# Patient Record
Sex: Male | Born: 1960 | Race: White | Hispanic: No | Marital: Married | Smoking: Current every day smoker
Health system: Southern US, Community
[De-identification: ages and names within clinical notes are randomized; demographics above are authoritative.]

## PROBLEM LIST (undated history)

## (undated) DIAGNOSIS — N503 Cyst of epididymis: Secondary | ICD-10-CM

## (undated) HISTORY — DX: Cyst of epididymis: N50.3

## (undated) HISTORY — PX: NASAL SINUS SURGERY: SHX719

## (undated) HISTORY — PX: KNEE SURGERY: SHX244

---

## 2012-08-24 ENCOUNTER — Ambulatory Visit: Payer: Self-pay | Admitting: Family Medicine

## 2013-11-27 ENCOUNTER — Encounter: Payer: Self-pay | Admitting: *Deleted

## 2013-11-27 ENCOUNTER — Emergency Department (INDEPENDENT_AMBULATORY_CARE_PROVIDER_SITE_OTHER)
Admission: EM | Admit: 2013-11-27 | Discharge: 2013-11-27 | Disposition: A | Discharge status: Home / Self Care | Payer: BC Managed Care – PPO | Source: Home / Self Care | Attending: Family Medicine | Admitting: Family Medicine

## 2013-11-27 DIAGNOSIS — J209 Acute bronchitis, unspecified: Secondary | ICD-10-CM

## 2013-11-27 MED ORDER — ALBUTEROL SULFATE HFA 108 (90 BASE) MCG/ACT IN AERS: 1.0000 | INHALATION_SPRAY | Freq: Four times a day (QID) | RESPIRATORY_TRACT | Status: DC | PRN

## 2013-11-27 MED ORDER — PREDNISONE 50 MG PO TABS: 50.0000 mg | ORAL_TABLET | Freq: Every day | ORAL | Status: DC

## 2013-11-27 MED ORDER — IPRATROPIUM-ALBUTEROL 0.5-2.5 (3) MG/3ML IN SOLN
3.0000 mL | Freq: Once | RESPIRATORY_TRACT | Status: AC
  Administered 2013-11-27: 3 mL via RESPIRATORY_TRACT

## 2013-11-27 NOTE — ED Notes (Signed)
Pt states he developed nasal congestion 1 week ago and it turned into a cough with yellow and white mucous and is now a dry cough.  Pt experiencing SOB and fatigue.

## 2013-11-27 NOTE — Discharge Instructions (Signed)
Thank you for coming in today. °Call or go to the emergency room if you get worse, have trouble breathing, have chest pains, or palpitations.  °Please quit smoking. ° °Acute Bronchitis °Bronchitis is inflammation of the airways that extend from the windpipe into the lungs (bronchi). The inflammation often causes mucus to develop. This leads to a cough, which is the most common symptom of bronchitis.  °In acute bronchitis, the condition usually develops suddenly and goes away over time, usually in a couple weeks. Smoking, allergies, and asthma can make bronchitis worse. Repeated episodes of bronchitis may cause further lung problems.  °CAUSES °Acute bronchitis is most often caused by the same virus that causes a cold. The virus can spread from person to person (contagious) through coughing, sneezing, and touching contaminated objects. °SIGNS AND SYMPTOMS  °· Cough.   °· Fever.   °· Coughing up mucus.   °· Body aches.   °· Chest congestion.   °· Chills.   °· Shortness of breath.   °· Sore throat.   °DIAGNOSIS  °Acute bronchitis is usually diagnosed through a physical exam. Your health care provider will also ask you questions about your medical history. Tests, such as chest X-rays, are sometimes done to rule out other conditions.  °TREATMENT  °Acute bronchitis usually goes away in a couple weeks. Oftentimes, no medical treatment is necessary. Medicines are sometimes given for relief of fever or cough. Antibiotic medicines are usually not needed but may be prescribed in certain situations. In some cases, an inhaler may be recommended to help reduce shortness of breath and control the cough. A cool mist vaporizer may also be used to help thin bronchial secretions and make it easier to clear the chest.  °HOME CARE INSTRUCTIONS °· Get plenty of rest.   °· Drink enough fluids to keep your urine clear or pale yellow (unless you have a medical condition that requires fluid restriction). Increasing fluids may help thin your  respiratory secretions (sputum) and reduce chest congestion, and it will prevent dehydration.   °· Take medicines only as directed by your health care provider. °· If you were prescribed an antibiotic medicine, finish it all even if you start to feel better. °· Avoid smoking and secondhand smoke. Exposure to cigarette smoke or irritating chemicals will make bronchitis worse. If you are a smoker, consider using nicotine gum or skin patches to help control withdrawal symptoms. Quitting smoking will help your lungs heal faster.   °· Reduce the chances of another bout of acute bronchitis by washing your hands frequently, avoiding people with cold symptoms, and trying not to touch your hands to your mouth, nose, or eyes.   °· Keep all follow-up visits as directed by your health care provider.   °SEEK MEDICAL CARE IF: °Your symptoms do not improve after 1 week of treatment.  °SEEK IMMEDIATE MEDICAL CARE IF: °· You develop an increased fever or chills.   °· You have chest pain.   °· You have severe shortness of breath. °· You have bloody sputum.   °· You develop dehydration. °· You faint or repeatedly feel like you are going to pass out. °· You develop repeated vomiting. °· You develop a severe headache. °MAKE SURE YOU:  °· Understand these instructions. °· Will watch your condition. °· Will get help right away if you are not doing well or get worse. °Document Released: 02/01/2004 Document Revised: 05/10/2013 Document Reviewed: 06/16/2012 °[employer]® Patient Information ©2015 [employer]. This information is not intended to replace advice given to you by your health care provider. Make sure you discuss any questions you   have with your health care provider. ° °

## 2013-11-27 NOTE — ED Provider Notes (Signed)
Jeffery Blackburn is a 53 y.o. male who presents to Urgent Care today for nasal congestion and cough wheezing and some shortness of breath. The cough is productive. Symptoms present for about a week. Patient had fever initially but none currently. No vomiting or diarrhea. Patient has tried Mucinex and Aleve which have helped.   History reviewed. No pertinent past medical history. Past Surgical History  Procedure Laterality Date  . Knee surgery     History  Substance Use Topics  . Smoking status: Current Every Day Smoker -- 0.50 packs/day    Types: Cigarettes  . Smokeless tobacco: Never Used  . Alcohol Use: 4.8 oz/week    8 Cans of beer per week   ROS as above Medications: Current Facility-Administered Medications  Medication Dose Route Frequency Provider Last Rate Last Dose  . ipratropium-albuterol (DUONEB) 0.5-2.5 (3) MG/3ML nebulizer solution 3 mL  3 mL Nebulization Once Rodolph BongEvan S Myrla Malanowski, MD       Current Outpatient Prescriptions  Medication Sig Dispense Refill  . dextromethorphan-guaiFENesin (MUCINEX DM) 30-600 MG per 12 hr tablet Take 1 tablet by mouth 2 (two) times daily.     No Known Allergies   Exam:  BP 142/86 mmHg  Pulse 70  Temp(Src) 97.7 F (36.5 C) (Oral)  Ht 5\' 9"  (1.753 m)  Wt 245 lb 8 oz (111.358 kg)  BMI 36.24 kg/m2  SpO2 99% Gen: Well NAD HEENT: EOMI,  MMM normal posterior pharynx and tympanic membranes Lungs: Normal work of breathing. Wheezing present bilaterally with expiratory phase Heart: RRR no MRG Abd: NABS, Soft. Nondistended, Nontender Exts: Brisk capillary refill, warm and well perfused.   Patient was given a 2.5/0.5 mg DuoNeb nebulizer treatment, and felt better  No results found for this or any previous visit (from the past 24 hour(s)). No results found.  Assessment and Plan: 53 y.o. male with viral bronchitis compensated by smoking. Treatment with prednisone and albuterol.  Discussed warning signs or symptoms. Please see discharge  instructions. Patient expresses understanding.     Rodolph BongEvan S Tachina Spoonemore, MD 11/27/13 1007

## 2013-12-08 ENCOUNTER — Emergency Department (INDEPENDENT_AMBULATORY_CARE_PROVIDER_SITE_OTHER)
Admission: EM | Admit: 2013-12-08 | Discharge: 2013-12-08 | Disposition: A | Discharge status: Home / Self Care | Payer: BC Managed Care – PPO | Source: Home / Self Care | Attending: Family Medicine | Admitting: Family Medicine

## 2013-12-08 ENCOUNTER — Encounter: Payer: Self-pay | Admitting: Emergency Medicine

## 2013-12-08 DIAGNOSIS — B9789 Other viral agents as the cause of diseases classified elsewhere: Principal | ICD-10-CM

## 2013-12-08 DIAGNOSIS — J069 Acute upper respiratory infection, unspecified: Secondary | ICD-10-CM

## 2013-12-08 MED ORDER — PENICILLIN V POTASSIUM 500 MG PO TABS: ORAL_TABLET | ORAL | Status: DC

## 2013-12-08 MED ORDER — ALBUTEROL SULFATE HFA 108 (90 BASE) MCG/ACT IN AERS: 1.0000 | INHALATION_SPRAY | Freq: Four times a day (QID) | RESPIRATORY_TRACT | Status: DC | PRN

## 2013-12-08 MED ORDER — BENZONATATE 200 MG PO CAPS: 200.0000 mg | ORAL_CAPSULE | Freq: Every day | ORAL | Status: DC

## 2013-12-08 NOTE — Discharge Instructions (Signed)
Take plain Mucinex (1200 mg guaifenesin) twice daily for cough and congestion.  May add Sudafed for sinus congestion.   Increase fluid intake, rest. May use Afrin nasal spray (or generic oxymetazoline) twice daily for about 5 days.  Also recommend using saline nasal spray several times daily and saline nasal irrigation (AYR is a common brand) Try warm salt water gargles for sore throat.  Stop all antihistamines for now, and other non-prescription cough/cold preparations. Continue inhaler as needed.  Follow-up with family doctor if not improving 7 to 10 days.

## 2013-12-08 NOTE — ED Provider Notes (Signed)
CSN: [id number]     Arrival date & time 12/08/13  0848 History   First MD Initiated Contact with Patient 12/08/13 [phone]     Chief Complaint  Patient presents with  . Nasal Congestion  . Cough  . Sore Throat      HPI Comments: Patient reports that he developed a cold about 2.5 weeks ago that finally resolved.  Three days ago he developed sweats.  Yesterday he developed increased sinus congestion, sore throat, fatigue, and cough with mild wheezing.  The history is provided by the patient.    History reviewed. No pertinent past medical history. Past Surgical History  Procedure Laterality Date  . Knee surgery     History reviewed. No pertinent family history. History  Substance Use Topics  . Smoking status: Current Every Day Smoker -- 0.50 packs/day    Types: Cigarettes  . Smokeless tobacco: Never Used  . Alcohol Use: 4.8 oz/week    8 Cans of beer per week    Review of Systems + sore throat + cough No pleuritic pain + wheezing + nasal congestion + post-nasal drainage No sinus pain/pressure No itchy/red eyes No earache No hemoptysis No SOB No fever, + chills/sweats No nausea No vomiting No abdominal pain No diarrhea No urinary symptoms No skin rash + fatigue No myalgias No headache Used OTC meds without relief  Allergies  Review of patient's allergies indicates no known allergies.  Home Medications   Prior to Admission medications   Medication Sig Start Date End Date Taking? Authorizing Provider  albuterol (PROVENTIL HFA;VENTOLIN HFA) 108 (90 BASE) MCG/ACT inhaler Inhale 1-2 puffs into the lungs every 6 (six) hours as needed for wheezing or shortness of breath. 12/08/13   Lattie HawStephen A Azaiah Licciardi, MD  benzonatate (TESSALON) 200 MG capsule Take 1 capsule (200 mg total) by mouth at bedtime. Take as needed for cough 12/08/13   Lattie HawStephen A Shawny Borkowski, MD  penicillin v potassium (VEETID) 500 MG tablet Take one tab by mouth twice daily for 10 days 12/08/13   Lattie HawStephen A German Manke, MD   BP  128/75 mmHg  Temp(Src) 98.7 F (37.1 C) (Oral)  Resp 16  Ht 5\' 10"  (1.778 m)  Wt 243 lb (110.224 kg)  BMI 34.87 kg/m2  SpO2 94% Physical Exam Nursing notes and Vital Signs reviewed. Appearance:  Patient appears stated age, and in no acute distress.  Patient is obese (BMI 34.9) Eyes:  Pupils are equal, round, and reactive to light and accomodation.  Extraocular movement is intact.  Conjunctivae are not inflamed  Ears:  Canals normal.  Tympanic membranes normal.  Nose:  Mildly congested turbinates.  No sinus tenderness.   Pharynx:  Normal Neck:  Supple.  Slightly tender shotty posterior nodes are palpated bilaterally  Lungs:  Clear to auscultation.  Breath sounds are equal.  Heart:  Regular rate and rhythm without murmurs, rubs, or gallops.  Abdomen:  Nontender without masses or hepatosplenomegaly.  Bowel sounds are present.  No CVA or flank tenderness.  Extremities:  No edema.  No calf tenderness Skin:  No rash present.   ED Course  Procedures  none   MDM   1. Viral URI with cough; recurrent     Will cover with antibiotic (patient requests penicillin). Prescription written for Benzonatate [facility](Tessalon) to take at bedtime for night-time cough.  Take plain Mucinex (1200 mg guaifenesin) twice daily for cough and congestion.  May add Sudafed for sinus congestion.   Increase fluid intake, rest. May use Afrin nasal spray (or  generic oxymetazoline) twice daily for about 5 days.  Also recommend using saline nasal spray several times daily and saline nasal irrigation (AYR is a common brand) Try warm salt water gargles for sore throat.  Stop all antihistamines for now, and other non-prescription cough/cold preparations. Continue inhaler as needed.  Follow-up with family doctor if not improving 7 to 10 days.    Lattie HawStephen A Betzabe Bevans, MD 12/12/13 [phone]

## 2013-12-08 NOTE — ED Notes (Signed)
States has had cough, congestion and some soreness of throat for 4 weeks; [facility] for same. No OTCs recently.

## 2015-10-16 ENCOUNTER — Emergency Department (INDEPENDENT_AMBULATORY_CARE_PROVIDER_SITE_OTHER): Payer: BLUE CROSS/BLUE SHIELD

## 2015-10-16 ENCOUNTER — Emergency Department (INDEPENDENT_AMBULATORY_CARE_PROVIDER_SITE_OTHER)
Admission: EM | Admit: 2015-10-16 | Discharge: 2015-10-16 | Disposition: A | Discharge status: Home / Self Care | Payer: BLUE CROSS/BLUE SHIELD | Source: Home / Self Care | Attending: Family Medicine | Admitting: Family Medicine

## 2015-10-16 ENCOUNTER — Encounter: Payer: Self-pay | Admitting: *Deleted

## 2015-10-16 DIAGNOSIS — N5089 Other specified disorders of the male genital organs: Secondary | ICD-10-CM

## 2015-10-16 DIAGNOSIS — R229 Localized swelling, mass and lump, unspecified: Secondary | ICD-10-CM | POA: Diagnosis not present

## 2015-10-16 DIAGNOSIS — N509 Disorder of male genital organs, unspecified: Secondary | ICD-10-CM

## 2015-10-16 DIAGNOSIS — N492 Inflammatory disorders of scrotum: Secondary | ICD-10-CM

## 2015-10-16 LAB — POCT URINALYSIS DIP (MANUAL ENTRY)
Bilirubin, UA: NEGATIVE
Blood, UA: NEGATIVE
Glucose, UA: NEGATIVE
Ketones, POC UA: NEGATIVE
Leukocytes, UA: NEGATIVE
Nitrite, UA: NEGATIVE
Protein Ur, POC: NEGATIVE
Spec Grav, UA: 1.02 (ref 1.005–1.03)
Urobilinogen, UA: 0.2 (ref 0–1)
pH, UA: 6.5 (ref 5–8)

## 2015-10-16 NOTE — ED Provider Notes (Signed)
[facility]    CSN: [id number] Arrival date & time: 10/16/15  0841     History   Chief Complaint Chief Complaint  Patient presents with  . Testicle Pain    HPI Rollene FareFoy Jeffery Blackburn is a 55 y.o. male.   Patient reports that he noted a slightly tender nodule on the posterior aspect of his right testicle about 6 weeks ago.  He has no pain although the lesion is mildly tender with palpation. The lesion has not changed in size.  He denies GU symptoms.  He feels well otherwise. He states that he needs to establish care with a PCP for continuity of care.    The history is provided by the patient.  Testicle Pain  This is a new problem. Episode onset: 6 weeks ago. The problem occurs constantly. The problem has not changed since onset.Pertinent negatives include no abdominal pain and no headaches. Nothing aggravates the symptoms. Nothing relieves the symptoms. He has tried nothing for the symptoms.    History reviewed. No pertinent past medical history.  There are no active problems to display for this patient.   Past Surgical History:  Procedure Laterality Date  . KNEE SURGERY    . NASAL SINUS SURGERY         Home Medications    Prior to Admission medications   Not on File    Family History History reviewed. No pertinent family history.  Social History Social History  Substance Use Topics  . Smoking status: Current Every Day Smoker    Packs/day: 0.50    Types: Cigarettes  . Smokeless tobacco: Never Used  . Alcohol use 4.8 oz/week    8 Cans of beer per week     Allergies   Review of patient's allergies indicates no known allergies.   Review of Systems Review of Systems  Constitutional: Negative.   HENT: Negative.   Eyes: Negative.   Respiratory: Negative.   Cardiovascular: Negative.   Gastrointestinal: Negative.  Negative for abdominal pain.  Genitourinary: Positive for testicular pain. Negative for discharge, dysuria, flank pain, frequency,  genital sores, hematuria, penile pain, penile swelling, scrotal swelling and urgency.  Musculoskeletal: Negative.   Skin: Negative.   Neurological: Negative for headaches.     Physical Exam Triage Vital Signs ED Triage Vitals  Enc Vitals Group     BP 10/16/15 0935 (!) 167/102     Pulse Rate 10/16/15 0935 77     Resp --      Temp 10/16/15 0935 98.4 F (36.9 C)     Temp Source 10/16/15 0935 Oral     SpO2 10/16/15 0935 96 %     Weight 10/16/15 0935 252 lb (114.3 kg)     Height --      Head Circumference --      Peak Flow --      Pain Score 10/16/15 0938 0     Pain Loc --      Pain Edu? --      Excl. in GC? --    No data found.   Updated Vital Signs BP (!) 167/102 (BP Location: Left Arm)   Pulse 77   Temp 98.4 F (36.9 C) (Oral)   Wt 252 lb (114.3 kg)   SpO2 96%   BMI 36.16 kg/m   Visual Acuity Right Eye Distance:   Left Eye Distance:   Bilateral Distance:    Right Eye Near:   Left Eye Near:    Bilateral Near:  Physical Exam  Constitutional: He appears well-developed and well-nourished. No distress.  HENT:  Head: Normocephalic.  Mouth/Throat: Oropharynx is clear and moist.  Eyes: Conjunctivae are normal. Pupils are equal, round, and reactive to light.  Neck: Neck supple.  Cardiovascular: Normal heart sounds.   Pulmonary/Chest: Breath sounds normal.  Abdominal: There is no tenderness. Hernia confirmed negative in the right inguinal area and confirmed negative in the left inguinal area.  Genitourinary: Penis normal. Right testis shows mass. Right testis shows no swelling and no tenderness. Left testis shows no mass, no swelling and no tenderness.  Genitourinary Comments: Palpation of the superior aspect of the right epididymus reveals a small firm mobile nodule about 6mm diameter.  There is minimal tenderness to palpation.  No swelling, warmth, or tenderness of the scrotum.  Musculoskeletal: He exhibits no edema.  Lymphadenopathy:    He has no cervical  adenopathy. No inguinal adenopathy noted on the right or left side.  Neurological: He is alert.  Skin: Skin is warm. No rash noted.  Nursing note and vitals reviewed.    UC Treatments / Results  Labs (all labs ordered are listed, but only abnormal results are displayed) Labs Reviewed  POCT URINALYSIS DIP (MANUAL ENTRY)    EKG  EKG Interpretation None       Radiology Study Result   CLINICAL DATA:  C/o RT side scrotal/epidimal knot the size of pea x 6 weeks with some tenderness. No injury.  EXAM: SCROTAL ULTRASOUND  DOPPLER ULTRASOUND OF THE TESTICLES  TECHNIQUE: Complete ultrasound examination of the testicles, epididymis, and other scrotal structures was performed. Color and spectral Doppler ultrasound were also utilized to evaluate blood flow to the testicles.  COMPARISON:  None.  FINDINGS: Right testicle  Measurements: 4.3 x 2.3 x 3.0 cm. No mass or microlithiasis visualized.  Left testicle  Measurements: 4.3 x 2.1 x 3.1 cm. No mass or microlithiasis visualized.  Right epididymis:  6 mm right epididymal head cyst.  Left epididymis:  Normal in size and appearance.  Hydrocele: Small bilateral hydroceles. Debris is seen within the right hydrocele.  Varicocele:  Small left varicocele.  Pulsed Doppler interrogation of both testes demonstrates normal low resistance arterial and venous waveforms bilaterally.  IMPRESSION: 1. No acute findings. No evidence of testicular torsion or epididymitis/ orchitis. 2. No testicular masses. 3. The palpable abnormality is either the 6 mm epididymal head cyst or the small right-sided hydrocele, most likely the former.   Electronically Signed   By: Amie Portland M.D.   On: 10/16/2015 11:09     Procedures Procedures (including critical care time)  Medications Ordered in UC Medications - No data to display   Initial Impression / Assessment and Plan / UC Course  I have reviewed the triage  vital signs and the nursing notes.  Pertinent labs & imaging results that were available during my care of the patient were reviewed by me and considered in my medical decision making (see chart for details).  Clinical Course  Suspect benign cyst of epididymus.  Reassurance. If nodule increases in size or becomes painful, recommend follow-up with urologist. Note significantly elevated blood pressure today. Followup with PCP to establish care as soon as possible.      Final Clinical Impressions(s) / UC Diagnoses   Final diagnoses:  Scrotal mass    New Prescriptions There are no discharge medications for this patient.    Lattie Haw, MD 10/22/15 1002

## 2015-10-16 NOTE — ED Triage Notes (Addendum)
Patient c/o 6 weeks of a sore spot underneath the skin on his testicle. Only c/o pain when he applies pressure. When applied pressure the pain radiates to his right groin. Denis any urinary changes. He is also in need of a PCP to schedule colonoscopy, annual exams , etc. Made apt with Dr. Denyse Amassorey 10/23/2015 @130pm . Apt time and location given to patient while in office.

## 2015-10-23 ENCOUNTER — Ambulatory Visit (INDEPENDENT_AMBULATORY_CARE_PROVIDER_SITE_OTHER): Payer: BLUE CROSS/BLUE SHIELD | Admitting: Family Medicine

## 2015-10-23 ENCOUNTER — Encounter: Payer: Self-pay | Admitting: Family Medicine

## 2015-10-23 VITALS — BP 140/78 | HR 61 | Wt 252.0 lb

## 2015-10-23 DIAGNOSIS — E6609 Other obesity due to excess calories: Secondary | ICD-10-CM | POA: Diagnosis not present

## 2015-10-23 DIAGNOSIS — Z1211 Encounter for screening for malignant neoplasm of colon: Secondary | ICD-10-CM

## 2015-10-23 DIAGNOSIS — Z0000 Encounter for general adult medical examination without abnormal findings: Secondary | ICD-10-CM | POA: Diagnosis not present

## 2015-10-23 DIAGNOSIS — Z6836 Body mass index (BMI) 36.0-36.9, adult: Secondary | ICD-10-CM

## 2015-10-23 DIAGNOSIS — F17200 Nicotine dependence, unspecified, uncomplicated: Secondary | ICD-10-CM | POA: Diagnosis not present

## 2015-10-23 DIAGNOSIS — N503 Cyst of epididymis: Secondary | ICD-10-CM | POA: Insufficient documentation

## 2015-10-23 HISTORY — DX: Cyst of epididymis: N50.3

## 2015-10-23 NOTE — Progress Notes (Signed)
       Jeffery Blackburn Jeffery Blackburn Hill[place]oss is a 55 y.o. male who [facility] today for establish care and well visit. Patient presents to clinic today with no acute symptoms. He feels well with no fevers chills nausea vomiting or diarrhea. No chest pains palpitations or shortness of breath. 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 He notes he's gained some weight recently. He smokes about a half to a full pack of cigarettes a day and uses smokeless tobacco occasionally. He does not take any medications nor does he have any drug allergies that he is aware of. He denies any significant past medical history.   Past Medical History:  Diagnosis Date  . Epididymal cyst 10/23/2015   Right seen on US October 2017   Past Surgical History:  Procedure Laterality Date  . KNEE SURGERY    . NASAL SINUS SURGERY     Social History  Substance Use Topics  . Smoking status: Current Every Day Smoker    Packs/day: 0.50    Types: Cigarettes  . Smokeless tobacco: Never Used  . Alcohol use 4.8 oz/week    8 Cans of beer per week   family history is not on file.  ROS as above: No headache, visual changes, nausea, vomiting, diarrhea, constipation, dizziness, abdominal pain, skin rash, fevers, chills, night sweats, weight loss, swollen lymph nodes, body aches, joint swelling, muscle aches, chest pain, shortness of breath, mood changes, visual or auditory hallucinations.    Medications: No current outpatient prescriptions on file.   No current facility-administered medications for this visit.    No Known Allergies  Health Maintenance Health Maintenance  Topic Date Due  . Hepatitis C Screening  15-Jan-1960  . HIV Screening  12/28/1976  . TETANUS/TDAP  12/28/1980  . COLONOSCOPY  12/29/2011  . INFLUENZA VACCINE  08/08/2015     Exam:  BP 140/78   Pulse 61   Wt 252  lb (114.3 kg)   BMI 36.16 kg/m  Gen: Well NAD HEENT: EOMI,  MMM Goiter or masses Lungs: Normal work of breathing. CTABL Heart: RRR no MRG Abd: NABS, Soft. Nondistended, Nontender Exts: Brisk capillary refill, warm and well perfused.  Skin: No significant melanotic-appearing lesions   No results found for this or any previous visit (from the past 72 hour(s)). No results found.    Assessment and Plan: 55 y.o. male with well adult. Comorbid factors include obesity and smoking status. Plan to obtain basic fasting labs and below as well as health maintenance items including hepatitis C and HIV screening as well as referral to gastroenterology for colonoscopy. Routine vaccines administered as well. Patient declines influenza vaccine today.   Orders Placed This Encounter  Procedures  . CBC  . COMPLETE METABOLIC PANEL WITH GFR  . Hemoglobin A1c  . HIV antibody  . Hepatitis C antibody  . Lipid panel  . PSA  . TSH  . VITAMIN D 25 Hydroxy (Vit-D Deficiency, Fractures)  . Ambulatory referral to Gastroenterology    Referral Priority:   Routine    Referral Type:   Consultation    Referral Reason:   Specialty Services Required    Requested Specialty:   Gastroenterology    Number of Visits Requested:   1    Discussed warning signs or symptoms. Please see discharge instructions. Patient expresses understanding.

## 2015-10-23 NOTE — Patient Instructions (Signed)
Thank you for coming in today. Get labs soon.  Work on cutting back or quitting smoking.  Return in 6-12 months or sooner if needed.

## 2015-11-06 DIAGNOSIS — F17200 Nicotine dependence, unspecified, uncomplicated: Secondary | ICD-10-CM | POA: Diagnosis not present

## 2015-11-06 DIAGNOSIS — Z0000 Encounter for general adult medical examination without abnormal findings: Secondary | ICD-10-CM | POA: Diagnosis not present

## 2015-11-06 DIAGNOSIS — Z1211 Encounter for screening for malignant neoplasm of colon: Secondary | ICD-10-CM | POA: Diagnosis not present

## 2015-11-06 DIAGNOSIS — K635 Polyp of colon: Secondary | ICD-10-CM | POA: Diagnosis not present

## 2015-11-06 LAB — HM COLONOSCOPY

## 2015-11-07 ENCOUNTER — Encounter: Payer: Self-pay | Admitting: Family Medicine

## 2015-11-07 DIAGNOSIS — E785 Hyperlipidemia, unspecified: Secondary | ICD-10-CM | POA: Insufficient documentation

## 2015-11-07 LAB — LIPID PANEL
Cholesterol: 220 mg/dL — ABNORMAL HIGH (ref 125–200)
HDL: 39 mg/dL — ABNORMAL LOW (ref >=40)
LDL Cholesterol: 153 mg/dL — ABNORMAL HIGH (ref <130)
Total CHOL/HDL Ratio: 5.6 Ratio — ABNORMAL HIGH (ref <=5.0)
Triglycerides: 142 mg/dL (ref <150)
VLDL: 28 mg/dL (ref <30)

## 2015-11-07 LAB — COMPLETE METABOLIC PANEL WITH GFR
ALT: 42 U/L (ref 9–46)
AST: 26 U/L (ref 10–35)
Albumin: 4.5 g/dL (ref 3.6–5.1)
Alkaline Phosphatase: 75 U/L (ref 40–115)
BUN: 10 mg/dL (ref 7–25)
CO2: 26 mmol/L (ref 20–31)
Calcium: 9.9 mg/dL (ref 8.6–10.3)
Chloride: 104 mmol/L (ref 98–110)
Creat: 1.12 mg/dL (ref 0.70–1.33)
GFR, Est African American: 86 mL/min (ref >=60)
GFR, Est Non African American: 75 mL/min (ref >=60)
Glucose, Bld: 97 mg/dL (ref 65–99)
Potassium: 5 mmol/L (ref 3.5–5.3)
Sodium: 140 mmol/L (ref 135–146)
Total Bilirubin: 0.6 mg/dL (ref 0.2–1.2)
Total Protein: 7.2 g/dL (ref 6.1–8.1)

## 2015-11-07 LAB — CBC
HCT: 52.2 % — ABNORMAL HIGH (ref 38.5–50.0)
Hemoglobin: 17.8 g/dL — ABNORMAL HIGH (ref 13.2–17.1)
MCH: 31.7 pg (ref 27.0–33.0)
MCHC: 34.1 g/dL (ref 32.0–36.0)
MCV: 93 fL (ref 80.0–100.0)
MPV: 13.1 fL — ABNORMAL HIGH (ref 7.5–12.5)
Platelets: 164 10*3/uL (ref 140–400)
RBC: 5.61 MIL/uL (ref 4.20–5.80)
RDW: 13.8 % (ref 11.0–15.0)
WBC: 6.9 10*3/uL (ref 3.8–10.8)

## 2015-11-07 LAB — HEMOGLOBIN A1C
Hgb A1c MFr Bld: 5.5 % (ref <5.7)
Mean Plasma Glucose: 111 mg/dL

## 2015-11-07 LAB — TSH: TSH: 1.85 mIU/L (ref 0.40–4.50)

## 2015-11-07 LAB — VITAMIN D 25 HYDROXY (VIT D DEFICIENCY, FRACTURES): Vit D, 25-Hydroxy: 21 ng/mL — ABNORMAL LOW (ref 30–100)

## 2015-11-07 LAB — PSA: PSA: 0.8 ng/mL (ref <=4.0)

## 2015-11-07 LAB — HEPATITIS C ANTIBODY: HCV Ab: NEGATIVE

## 2015-11-07 LAB — HIV ANTIBODY (ROUTINE TESTING W REFLEX): HIV 1&2 Ab, 4th Generation: NONREACTIVE

## 2015-11-07 MED ORDER — ATORVASTATIN CALCIUM 40 MG PO TABS: 40.0000 mg | ORAL_TABLET | Freq: Every day | ORAL | 0 refills | Status: DC

## 2015-11-07 NOTE — Addendum Note (Signed)
Addended by: Rodolph BongOREY, Shree Espey S on: 11/07/2015 07:07 AM   Modules accepted: Orders

## 2016-10-17 DIAGNOSIS — S39012A Strain of muscle, fascia and tendon of lower back, initial encounter: Secondary | ICD-10-CM | POA: Diagnosis not present

## 2016-10-17 DIAGNOSIS — I10 Essential (primary) hypertension: Secondary | ICD-10-CM | POA: Diagnosis not present

## 2016-10-17 DIAGNOSIS — R300 Dysuria: Secondary | ICD-10-CM | POA: Diagnosis not present

## 2016-10-21 ENCOUNTER — Ambulatory Visit (INDEPENDENT_AMBULATORY_CARE_PROVIDER_SITE_OTHER): Payer: BLUE CROSS/BLUE SHIELD | Admitting: Family Medicine

## 2016-10-21 VITALS — BP 153/67 | HR 57 | Wt 253.0 lb

## 2016-10-21 DIAGNOSIS — I10 Essential (primary) hypertension: Secondary | ICD-10-CM | POA: Diagnosis not present

## 2016-10-21 DIAGNOSIS — E782 Mixed hyperlipidemia: Secondary | ICD-10-CM

## 2016-10-21 DIAGNOSIS — Z23 Encounter for immunization: Secondary | ICD-10-CM | POA: Diagnosis not present

## 2016-10-21 DIAGNOSIS — Z125 Encounter for screening for malignant neoplasm of prostate: Secondary | ICD-10-CM

## 2016-10-21 DIAGNOSIS — E559 Vitamin D deficiency, unspecified: Secondary | ICD-10-CM | POA: Insufficient documentation

## 2016-10-21 MED ORDER — LISINOPRIL 10 MG PO TABS: 10.0000 mg | ORAL_TABLET | Freq: Every day | ORAL | 0 refills | Status: DC

## 2016-10-21 MED ORDER — ATORVASTATIN CALCIUM 40 MG PO TABS: 40.0000 mg | ORAL_TABLET | Freq: Every day | ORAL | 0 refills | Status: DC

## 2016-10-21 NOTE — Patient Instructions (Signed)
Thank you for coming in today. Start lisinopril and Lipitor.  Recheck with me in 6 weeks or so.  GEt fasting labs about 1 week before you return.  Any [facility] Location is OK.   Lisinopril tablets What is this medicine? LISINOPRIL (lyse IN oh pril) is an ACE inhibitor. This medicine is used to treat high blood pressure and heart failure. It is also used to protect the heart immediately after a heart attack. This medicine may be used for other purposes; ask your health care provider or pharmacist if you have questions. COMMON BRAND NAME(S): Prinivil, Zestril What should I tell my health care provider before I take this medicine? They need to know if you have any of these conditions: -diabetes -heart or blood vessel disease -kidney disease -low blood pressure -previous swelling of the tongue, face, or lips with difficulty breathing, difficulty swallowing, hoarseness, or tightening of the throat -an unusual or allergic reaction to lisinopril, other ACE inhibitors, insect venom, foods, dyes, or preservatives -pregnant or trying to get pregnant -breast-feeding How should I use this medicine? Take this medicine by mouth with a glass of water. Follow the directions on your prescription label. You may take this medicine with or without food. If it upsets your stomach, take it with food. Take your medicine at regular intervals. Do not take it more often than directed. Do not stop taking except on your doctor's advice. Talk to your pediatrician regarding the use of this medicine in children. Special care may be needed. While this drug may be prescribed for children as young as 19 years of age for selected conditions, precautions do apply. Overdosage: If you think you have taken too much of this medicine contact a poison control center or emergency room at once. NOTE: This medicine is only for you. Do not share this medicine with others. What if I miss a dose? If you miss a dose, take it as soon as  you can. If it is almost time for your next dose, take only that dose. Do not take double or extra doses. What may interact with this medicine? Do not take this medicine with any of the following medications: -hymenoptera venom -sacubitril; valsartan This medicines may also interact with the following medications: -aliskiren -angiotensin receptor blockers, like losartan or valsartan -certain medicines for diabetes -diuretics -everolimus -gold compounds -lithium -NSAIDs, medicines for pain and inflammation, like ibuprofen or naproxen -potassium salts or supplements -salt substitutes -sirolimus -temsirolimus This list may not describe all possible interactions. Give your health care provider a list of all the medicines, herbs, non-prescription drugs, or dietary supplements you use. Also tell them if you smoke, drink alcohol, or use illegal drugs. Some items may interact with your medicine. What should I watch for while using this medicine? Visit your doctor or health care professional for regular check ups. Check your blood pressure as directed. Ask your doctor what your blood pressure should be, and when you should contact him or her. Do not treat yourself for coughs, colds, or pain while you are using this medicine without asking your doctor or health care professional for advice. Some ingredients may increase your blood pressure. Women should inform their doctor if they wish to become pregnant or think they might be pregnant. There is a potential for serious side effects to an unborn child. Talk to your health care professional or pharmacist for more information. Check with your doctor or health care professional if you get an attack of severe diarrhea, nausea and  vomiting, or if you sweat a lot. The loss of too much body fluid can make it dangerous for you to take this medicine. You may get drowsy or dizzy. Do not drive, use machinery, or do anything that needs mental alertness until you  know how this drug affects you. Do not stand or sit up quickly, especially if you are an older patient. This reduces the risk of dizzy or fainting spells. Alcohol can make you more drowsy and dizzy. Avoid alcoholic drinks. Avoid salt substitutes unless you are told otherwise by your doctor or health care professional. What side effects may I notice from receiving this medicine? Side effects that you should report to your doctor or health care professional as soon as possible: -allergic reactions like skin rash, itching or hives, swelling of the hands, feet, face, lips, throat, or tongue -breathing problems -signs and symptoms of kidney injury like trouble passing urine or change in the amount of urine -signs and symptoms of increased potassium like muscle weakness; chest pain; or fast, irregular heartbeat -signs and symptoms of liver injury like dark yellow or brown urine; general ill feeling or flu-like symptoms; light-colored stools; loss of appetite; nausea; right upper belly pain; unusually weak or tired; yellowing of the eyes or skin -signs and symptoms of low blood pressure like dizziness; feeling faint or lightheaded, falls; unusually weak or tired -stomach pain with or without nausea and vomiting Side effects that usually do not require medical attention (report to your doctor or health care professional if they continue or are bothersome): -changes in taste -cough -dizziness -fever -headache -sensitivity to light This list may not describe all possible side effects. Call your doctor for medical advice about side effects. You may report side effects to FDA at 1-800-FDA-1088. Where should I keep my medicine? Keep out of the reach of children. Store at room temperature between 15 and 30 degrees C (59 and 86 degrees F). Protect from moisture. Keep container tightly closed. Throw away any unused medicine after the expiration date. NOTE: This sheet is a summary. It may not cover all possible  information. If you have questions about this medicine, talk to your doctor, pharmacist, or health care provider.  2018 Elsevier/Gold Standard (2015-02-13 12:52:35)   Atorvastatin tablets What is this medicine? ATORVASTATIN (a TORE va sta tin) is known as a HMG-CoA reductase inhibitor or 'statin'. It lowers the level of cholesterol and triglycerides in the blood. This drug may also reduce the risk of heart attack, stroke, or other health problems in patients with risk factors for heart disease. Diet and lifestyle changes are often used with this drug. This medicine may be used for other purposes; ask your health care provider or pharmacist if you have questions. COMMON BRAND NAME(S): Lipitor What should I tell my health care provider before I take this medicine? They need to know if you have any of these conditions: -frequently drink alcoholic beverages -history of stroke, TIA -kidney disease -liver disease -muscle aches or weakness -other medical condition -an unusual or allergic reaction to atorvastatin, other medicines, foods, dyes, or preservatives -pregnant or trying to get pregnant -breast-feeding How should I use this medicine? Take this medicine by mouth with a glass of water. Follow the directions on the prescription label. You can take this medicine with or without food. Take your doses at regular intervals. Do not take your medicine more often than directed. Talk to your pediatrician regarding the use of this medicine in children. While this drug may be  prescribed for children as young as 78 years old for selected conditions, precautions do apply. Overdosage: If you think you have taken too much of this medicine contact a poison control center or emergency room at once. NOTE: This medicine is only for you. Do not share this medicine with others. What if I miss a dose? If you miss a dose, take it as soon as you can. If it is almost time for your next dose, take only that dose. Do  not take double or extra doses. What may interact with this medicine? Do not take this medicine with any of the following medications: -red yeast rice -telaprevir -telithromycin -voriconazole This medicine may also interact with the following medications: -alcohol -antiviral medicines for HIV or AIDS -boceprevir -certain antibiotics like clarithromycin, erythromycin, troleandomycin -certain medicines for cholesterol like fenofibrate or gemfibrozil -cimetidine -clarithromycin -colchicine -cyclosporine -digoxin -male hormones, like estrogens or progestins and birth control pills -grapefruit juice -medicines for fungal infections like fluconazole, itraconazole, ketoconazole -niacin -rifampin -spironolactone This list may not describe all possible interactions. Give your health care provider a list of all the medicines, herbs, non-prescription drugs, or dietary supplements you use. Also tell them if you smoke, drink alcohol, or use illegal drugs. Some items may interact with your medicine. What should I watch for while using this medicine? Visit your doctor or health care professional for regular check-ups. You may need regular tests to make sure your liver is working properly. Tell your doctor or health care professional right away if you get any unexplained muscle pain, tenderness, or weakness, especially if you also have a fever and tiredness. Your doctor or health care professional may tell you to stop taking this medicine if you develop muscle problems. If your muscle problems do not go away after stopping this medicine, contact your health care professional. This drug is only part of a total heart-health program. Your doctor or a dietician can suggest a low-cholesterol and low-fat diet to help. Avoid alcohol and smoking, and keep a proper exercise schedule. Do not use this drug if you are pregnant or breast-feeding. Serious side effects to an unborn child or to an infant are  possible. Talk to your doctor or pharmacist for more information. This medicine may affect blood sugar levels. If you have diabetes, check with your doctor or health care professional before you change your diet or the dose of your diabetic medicine. If you are going to have surgery tell your health care professional that you are taking this drug. What side effects may I notice from receiving this medicine? Side effects that you should report to your doctor or health care professional as soon as possible: -allergic reactions like skin rash, itching or hives, swelling of the face, lips, or tongue -dark urine -fever -joint pain -muscle cramps, pain -redness, blistering, peeling or loosening of the skin, including inside the mouth -trouble passing urine or change in the amount of urine -unusually weak or tired -yellowing of eyes or skin Side effects that usually do not require medical attention (report to your doctor or health care professional if they continue or are bothersome): -constipation -heartburn -stomach gas, pain, upset This list may not describe all possible side effects. Call your doctor for medical advice about side effects. You may report side effects to FDA at 1-800-FDA-1088. Where should I keep my medicine? Keep out of the reach of children. Store at room temperature between 20 to 25 degrees C (68 to 77 degrees F). Throw away any unused  medicine after the expiration date. NOTE: This sheet is a summary. It may not cover all possible information. If you have questions about this medicine, talk to your doctor, pharmacist, or health care provider.  2018 Elsevier/Gold Standard (2010-11-13 16:10:96)

## 2016-10-21 NOTE — Progress Notes (Signed)
Jeffery Blackburn is a 56 y.o. male who presents to [name]: [facility] today for hypertension.  Patient was recently seen in Urgent Care for a muscular strain and found to also have elevated blood pressure at 170/95. Patient has had high blood pressure in the past, but was concerned about this measurement. He states he has gained about 20 lbs recently due to inability to exercise and increased stress at work. Patient has tried making some dietary adjustments including cutting out biscuits for breakfast and limiting his portion size. He would like to continue exercising once he has recovered from his muscle strain. Patient also reports fluctuating between a half to 1 pack of cigarettes per day. He is actively working to maintain this at a half pack per day. Patient reports consuming about 2 beers per day.   Hyperlipidemia: Patient has a history of high cholesterol. He has not taken atorvastatin yet, but he is willing to begin this now.   Past Medical History:  Diagnosis Date  . Epididymal cyst 10/23/2015   Right seen on [place] October 2017   Past Surgical History:  Procedure Laterality Date  . KNEE SURGERY    . NASAL SINUS SURGERY     Social History  Substance Use Topics  . Smoking status: Current Every Day Smoker    Packs/day: 0.50    Types: Cigarettes  . Smokeless tobacco: Never Used  . Alcohol use 4.8 oz/week    8 Cans of beer per week   family history is not on file.  ROS as above:  Medications: Current Outpatient Prescriptions  Medication Sig Dispense Refill  . atorvastatin (LIPITOR) 40 MG tablet Take 1 tablet (40 mg total) by mouth daily. 90 tablet 0  . lisinopril (PRINIVIL,ZESTRIL) 10 MG tablet Take 1 tablet (10 mg total) by mouth daily. 90 tablet 0   No current facility-administered medications for this visit.    No Known Allergies  Health Maintenance Health  Maintenance  Topic Date Due  . INFLUENZA VACCINE  10/21/2017 (Originally 08/07/2016)  . COLONOSCOPY  11/05/2025  . TETANUS/TDAP  10/22/2026  . Hepatitis C Screening  Completed  . HIV Screening  Completed     Exam:  BP (!) 153/67   Pulse (!) 57   Wt 253 lb (114.8 kg)   BMI 36.30 kg/m  Gen: Well NAD, sitting comfortably in chair HEENT: EOMI,  MMM Lungs: Normal work of breathing. CTABL Heart: RRR, normal S1 and S2, no MRG Abd: NABS, Soft. Nondistended, Nontender Exts: Brisk capillary refill, warm and well perfused.    No results found for this or any previous visit (from the past 72 hour(s)). No results found.    Assessment and Plan: 56 y.o. male with hypertension. The importance of weight loss and exercise were discussed with the patient. He will start lisinopril as well. Patient will follow-up in 6 weeks for recheck of blood pressure and obtain fasting labs about 1 week prior to this visit.   Hyperlipidemia: The risks and benefits of statin therapy were discussed. Patient will begin atorvastatin.   Smoking: Patient is not interested in taking any medication to help with smoking cessation at this time. He is working to decrease his smoking to a half pack per day. The benefits of smoking cessation were reviewed with the patient.     Orders Placed This Encounter  Procedures  . Tdap vaccine greater than or equal to 7yo IM  . CBC  .  COMPLETE METABOLIC PANEL WITH GFR  . Lipid Panel w/reflex Direct LDL  . VITAMIN D 25 Hydroxy (Vit-D Deficiency, Fractures)  . PSA   Meds ordered this encounter  Medications  . atorvastatin (LIPITOR) 40 MG tablet    Sig: Take 1 tablet (40 mg total) by mouth daily.    Dispense:  90 tablet    Refill:  0  . lisinopril (PRINIVIL,ZESTRIL) 10 MG tablet    Sig: Take 1 tablet (10 mg total) by mouth daily.    Dispense:  90 tablet    Refill:  0     Discussed warning signs or symptoms. Please see discharge instructions. Patient expresses  understanding.

## 2016-10-24 IMAGING — US US SCROTUM
1 series · 13 of 25 positions shown · non-contrast
Comparison: None.

CLINICAL DATA: C/o RT side scrotal/epidimal knot the size of Brahmi Kasmi
6 weeks with some tenderness. No injury.

EXAM:
SCROTAL ULTRASOUND
DOPPLER ULTRASOUND OF THE TESTICLES
TECHNIQUE: Complete ultrasound examination of the testicles, epididymis, and
other scrotal structures was performed. Color and spectral Doppler
ultrasound were also utilized to evaluate blood flow to the
testicles.

[Series 1: us scrotum · 0.07mm/px · 13 of 72 slices shown]
[im 1/72]
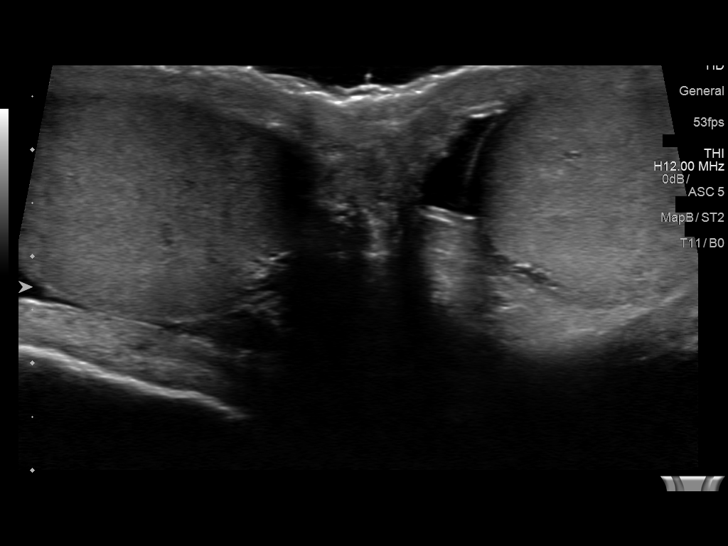
[im 6/72]
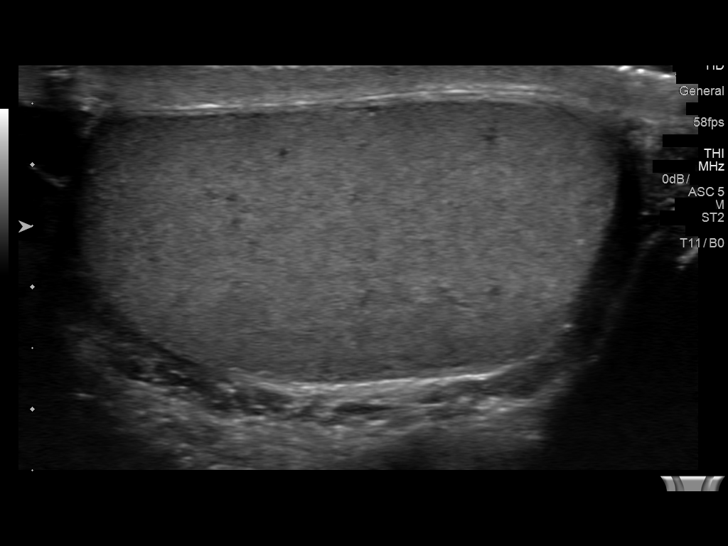
[im 12/72]
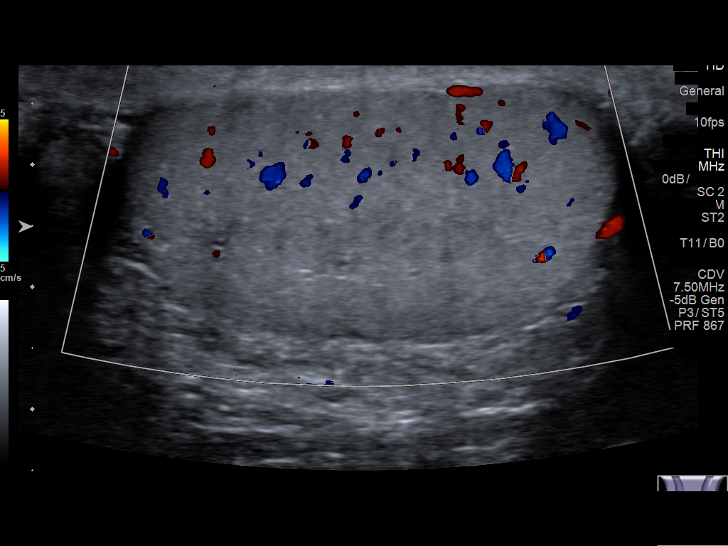
[im 18/72]
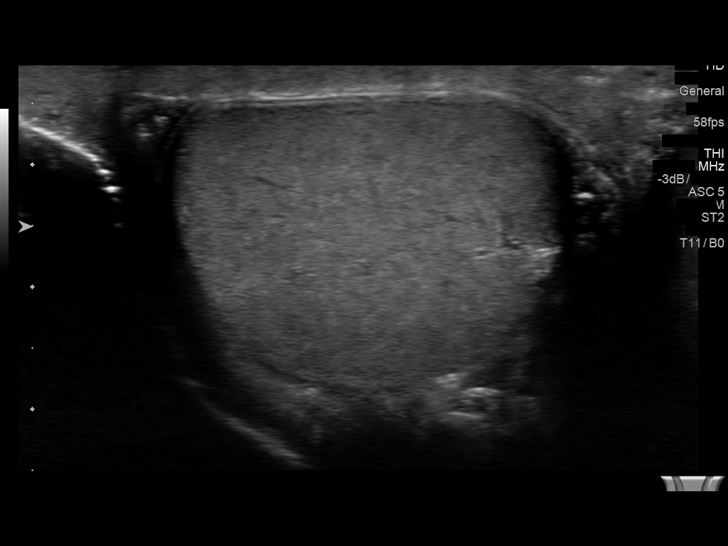
[im 24/72]
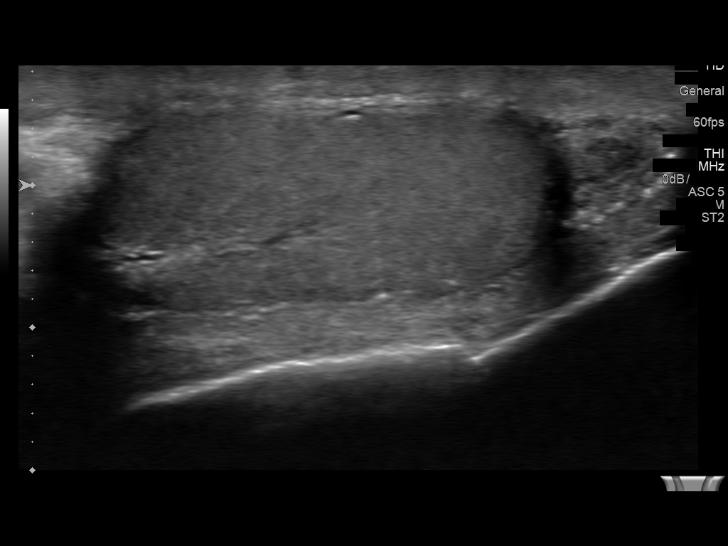
[im 30/72]
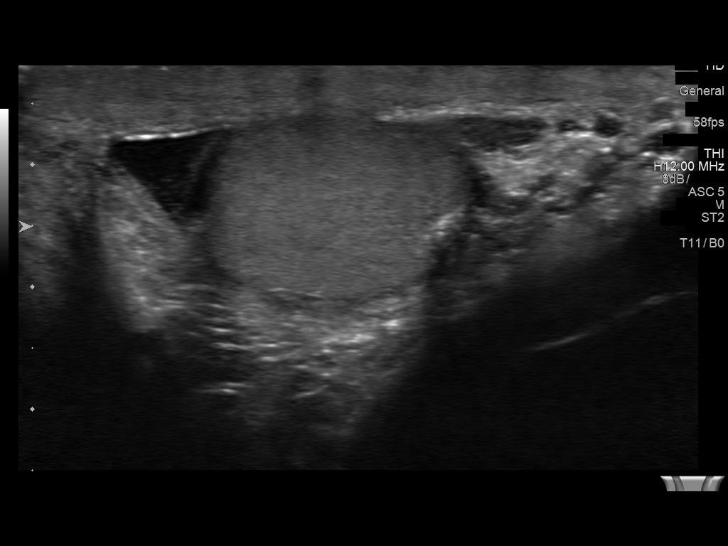
[im 36/72]
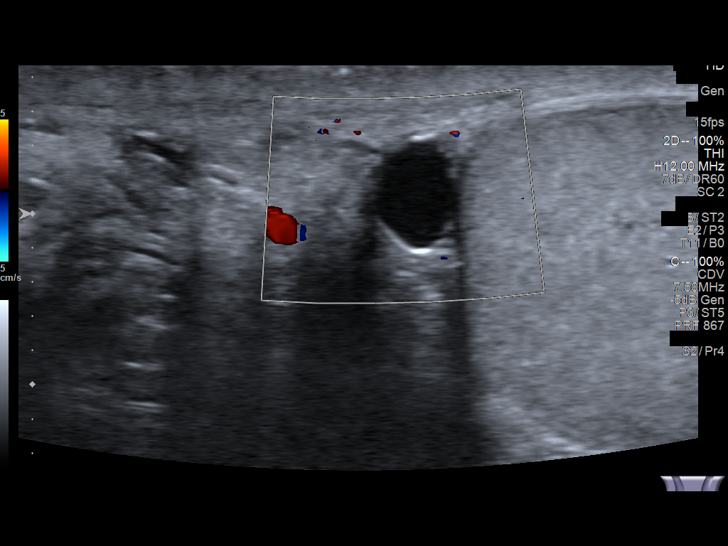
[im 42/72]
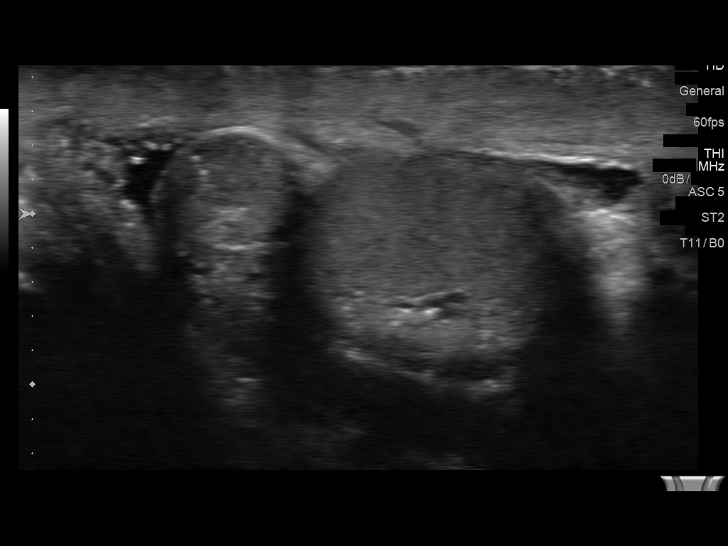
[im 48/72]
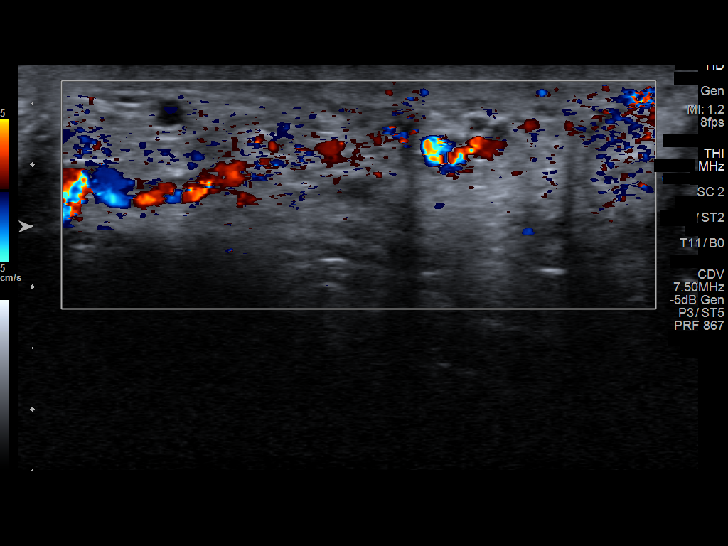
[im 54/72]
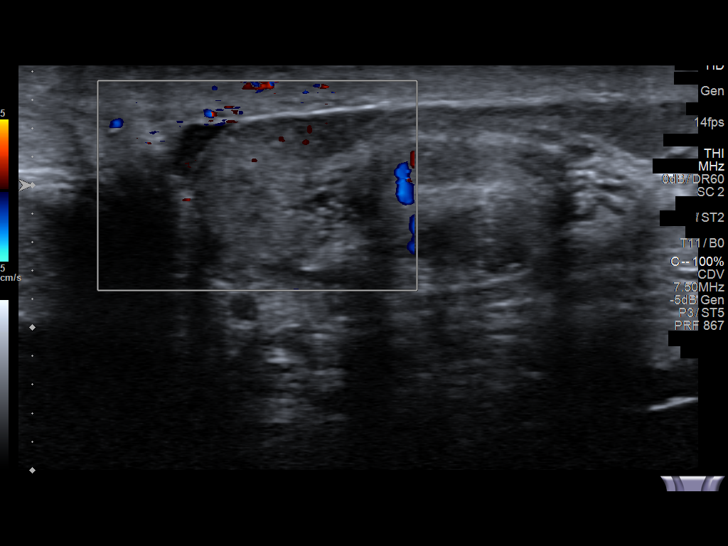
[im 60/72]
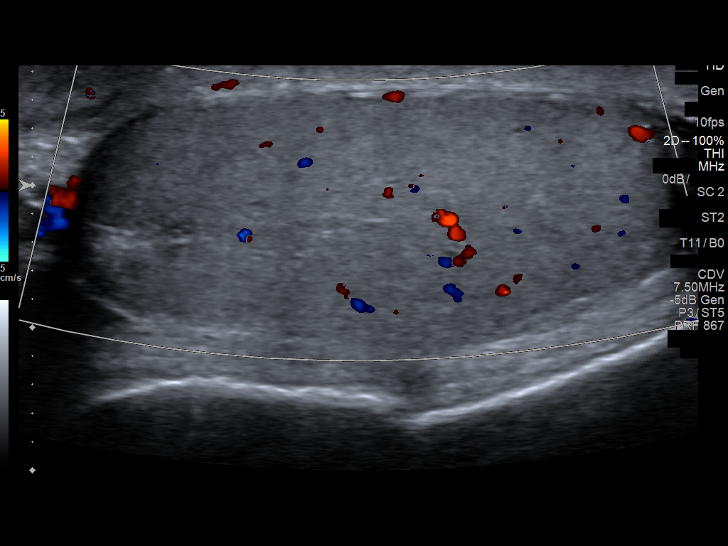
[im 66/72]
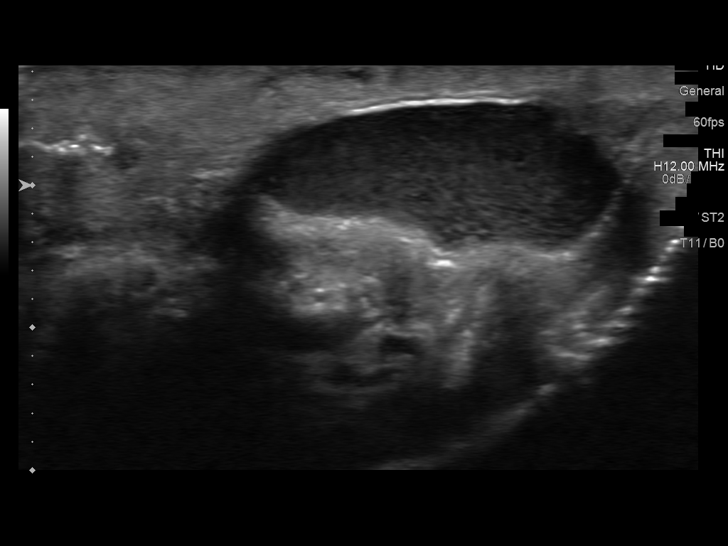
[im 72/72]
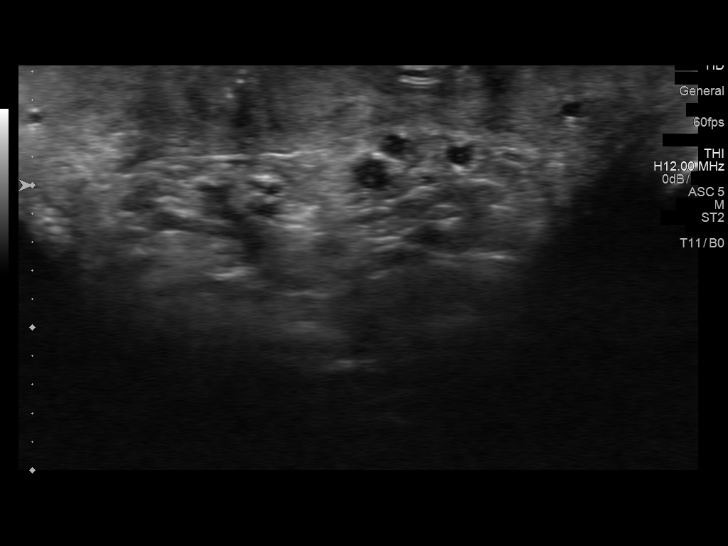

[13 of 25 positions shown; findings below may reference images not displayed]

FINDINGS: Right testicle

Measurements: 4.3 x 2.3 x 3.0 cm. No mass or microlithiasis
visualized.

Left testicle

Measurements: 4.3 x 2.1 x 3.1 cm. No mass or microlithiasis
visualized.

Right epididymis:  6 mm right epididymal head cyst.

Left epididymis:  Normal in size and appearance.

Hydrocele: Small bilateral hydroceles. Debris is seen within the
right hydrocele.

Varicocele:  Small left varicocele.

Pulsed Doppler interrogation of both testes demonstrates normal low
resistance arterial and venous waveforms bilaterally.
IMPRESSION: 1. No acute findings. No evidence of testicular torsion or
epididymitis/ orchitis.
2. No testicular masses.
3. The palpable abnormality is either the 6 mm epididymal head cyst
or the small right-sided hydrocele, most likely the former.

## 2016-12-02 ENCOUNTER — Ambulatory Visit: Payer: BLUE CROSS/BLUE SHIELD | Admitting: Family Medicine

## 2018-03-03 ENCOUNTER — Ambulatory Visit (INDEPENDENT_AMBULATORY_CARE_PROVIDER_SITE_OTHER): Payer: BLUE CROSS/BLUE SHIELD | Admitting: Family Medicine

## 2018-03-03 ENCOUNTER — Encounter: Payer: Self-pay | Admitting: Family Medicine

## 2018-03-03 VITALS — BP 153/75 | HR 56 | Ht 70.0 in | Wt 250.0 lb

## 2018-03-03 DIAGNOSIS — I10 Essential (primary) hypertension: Secondary | ICD-10-CM

## 2018-03-03 DIAGNOSIS — M7701 Medial epicondylitis, right elbow: Secondary | ICD-10-CM

## 2018-03-03 DIAGNOSIS — F17200 Nicotine dependence, unspecified, uncomplicated: Secondary | ICD-10-CM | POA: Diagnosis not present

## 2018-03-03 MED ORDER — DICLOFENAC SODIUM 1 % TD GEL: 2.0000 g | Freq: Four times a day (QID) | TRANSDERMAL | 11 refills | Status: DC

## 2018-03-03 NOTE — Patient Instructions (Addendum)
Thank you for coming in today. 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  Addressing the work position is really improtant.   Use a wrist brace as needed and at night.   Do the stretch as much as you can,.  Remember to get the elbow straight.  Do the exercise. Elbow straight with a 1-2 pound hand weight.  Let the wrist go from the up position to the down position slowly over 3 seconds. You can also use Theraband Flexbar.  Remember to get the elbow straight.   Use the diclofenac gel as needed.    Cut the carbs and lose weight.   Recheck in 6 weeks or sooner if needed.

## 2018-03-03 NOTE — Progress Notes (Signed)
Jeffery Blackburn is a 58 y.o. male who presents to [facility] today for right arm pain.  [name] has a 1 month history of pain in the right arm.  Pain is located primarily at the medial epicondyle along the forearm.  He does have some numbness and tingling into his fingers as well.  His pain started after he switched positions at work.  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  He thinks the machine caused some wrist and elbow pain.  He since adjust the machine and is more comfortable however he continues to have some pain.  Symptoms are moderate worse with activity better with rest.  He does have some numbness and tingling in his hand when he wakes up in the morning.  He denies any injury history.  He is tried tumeric ice and stretching which is helped only a little.   Additionally he has a history of hypertension.  He has been somewhat lost to follow-up.  He thinks his blood pressure was better controlled when he lost weight over the summer.  He since had weight regain and would like to try to lose weight again to control his blood pressure  Additionally he has been working on cutting back on smoking.  He smokes a few cigarettes a day now down from about a pack a day.  ROS as above:  Exam:  BP (!) 153/75   Pulse (!) 56   Ht 5\' 10"  (1.778 m)   Wt 250 lb (113.4 kg)   BMI 35.87 kg/m  Wt Readings from Last 5 Encounters:  03/03/18 250 lb (113.4 kg)  10/21/16 253 lb (114.8 kg)  10/23/15 252 lb (114.3 kg)  10/16/15 252 lb (114.3 kg)  12/08/13 243 lb (110.2 kg)    Gen: Well NAD HEENT: EOMI,  MMM Lungs: Normal work of breathing. CTABL Heart: RRR no MRG Abd: NABS, Soft. Nondistended, Nontender Exts: Brisk capillary refill, warm and well perfused.  Right arm normal-appearing no significant deformities. Right shoulder: Normal motion. Right elbow  normal motion. Tender to palpation medial epicondyle.  Minimally tender lateral epicondyle. Elbow strength is intact. Right forearm: Tender to palpation mildly along the volar forearm.  Nontender dorsal forearm. Right wrist normal-appearing Mildly tender palpation volar wrist Positive Tinel's at carpal tunnel Pain referred to medial elbow with resisted wrist flexion and grip strength.  No significant pain to lateral elbow with resisted extension. Right hand normal-appearing nontender normal motion normal strength  Contralateral left arm normal-appearing normal motion normal strength negative Tinel's    Assessment and Plan: 58 y.o. male with  Right forearm pain very likely medial epicondylitis and carpal tunnel syndrome.  Discussed options.  Plan for over-the-counter wrist brace home exercise program and stretching.  Work on eccentric exercises as well.  Additionally use diclofenac gel.  Recheck in about a month.  Return sooner if needed.  Discussed ergonomics at work as well.  Hypertension: Blood pressure not controlled.  Recheck in the near future.  We will likely start antihypertensive management as well.  Discussed weight loss strategies as well.  Smoking: Continue to wean off of cigarettes.  PDMP not reviewed this encounter. No orders of the defined types were placed in this encounter.  Meds ordered this encounter  Medications  . diclofenac sodium (VOLTAREN) 1 % GEL    Sig: Apply 2 g topically 4 (four) times daily. To affected joint.  Dispense:  100 g    Refill:  11     Historical information moved to improve visibility of documentation.  Past Medical History:  Diagnosis Date  . Epididymal cyst 10/23/2015   Right seen on [place] October 2017   Past Surgical History:  Procedure Laterality Date  . KNEE SURGERY    . NASAL SINUS SURGERY     Social History   Tobacco Use  . Smoking status: Current Every Day Smoker    Packs/day: 0.50    Types: Cigarettes  . Smokeless  tobacco: Never Used  Substance Use Topics  . Alcohol use: Yes    Alcohol/week: 8.0 standard drinks    Types: 8 Cans of beer per week   family history is not on file.  Medications: Current Outpatient Medications  Medication Sig Dispense Refill  . diclofenac sodium (VOLTAREN) 1 % GEL Apply 2 g topically 4 (four) times daily. To affected joint. 100 g 11   No current facility-administered medications for this visit.    No Known Allergies   Discussed warning signs or symptoms. Please see discharge instructions. Patient expresses understanding.

## 2018-04-14 ENCOUNTER — Ambulatory Visit: Payer: BLUE CROSS/BLUE SHIELD | Admitting: Family Medicine

## 2019-09-27 ENCOUNTER — Other Ambulatory Visit (HOSPITAL_COMMUNITY): Payer: Self-pay | Admitting: Nurse Practitioner

## 2019-09-27 DIAGNOSIS — U071 COVID-19: Secondary | ICD-10-CM

## 2019-09-27 NOTE — Progress Notes (Signed)
I connected by phone with patient to discuss the potential use of a new treatment for mild to moderate COVID-19 viral infection in non-hospitalized patients.   This patient is a that meets the FDA criteria for Emergency Use Authorization of COVID monoclonal antibody casirivimab/imdevimab. 1. Has a (+) direct SARS-CoV-2 viral test result 2. Has mild or moderate COVID-19  3. Is NOT hospitalized due to COVID-19 4. Is within 10 days of symptom onset 5. Has at least one of the high risk factor(s) for progression to severe COVID-19 and/or hospitalization as defined in EUA. ? Specific high risk criteria : age, hypertension, obesity     I have spoken and communicated the following to the patient or parent/caregiver regarding COVID monoclonal antibody treatment:   1. FDA has authorized the emergency use for the treatment of high risk post-exposure prophylaxis for COVID19.    2. The significant known and potential risks and benefits of COVID monoclonal antibody, and the extent to which such potential risks and benefits are unknown.   3. Information on available alternative treatments and the risks and benefits of those alternatives, including clinical trials.   4. Patients treated with COVID monoclonal antibody should continue to self-isolate and use infection control measures (e.g., wear mask, isolate, social distance, avoid sharing personal items, clean and disinfect "high touch" surfaces, and frequent handwashing) according to CDC guidelines.    5. The patient or parent/caregiver has the option to accept or refuse COVID monoclonal antibody treatment.   After reviewing this information with the patient, The patient agreed to proceed with receiving casirivimab\imdevimab infusion and will be provided a copy of the Fact sheet prior to receiving the infusion. Ocie Bob, NP

## 2019-09-28 ENCOUNTER — Ambulatory Visit (HOSPITAL_COMMUNITY)
Admission: RE | Admit: 2019-09-28 | Discharge: 2019-09-28 | Disposition: A | Discharge status: Home / Self Care | Payer: BC Managed Care – PPO | Source: Ambulatory Visit | Attending: Pulmonary Disease | Admitting: Pulmonary Disease

## 2019-09-28 DIAGNOSIS — U071 COVID-19: Secondary | ICD-10-CM | POA: Insufficient documentation

## 2019-09-28 MED ORDER — SODIUM CHLORIDE 0.9 % IV SOLN
1200.0000 mg | Freq: Once | INTRAVENOUS | Status: AC
  Administered 2019-09-28: 1200 mg via INTRAVENOUS

## 2019-09-28 MED ORDER — ALBUTEROL SULFATE HFA 108 (90 BASE) MCG/ACT IN AERS: 2.0000 | INHALATION_SPRAY | Freq: Once | RESPIRATORY_TRACT | Status: DC | PRN

## 2019-09-28 MED ORDER — FAMOTIDINE IN NACL 20-0.9 MG/50ML-% IV SOLN: 20.0000 mg | Freq: Once | INTRAVENOUS | Status: DC | PRN

## 2019-09-28 MED ORDER — EPINEPHRINE 0.3 MG/0.3ML IJ SOAJ: 0.3000 mg | Freq: Once | INTRAMUSCULAR | Status: DC | PRN

## 2019-09-28 MED ORDER — SODIUM CHLORIDE 0.9 % IV SOLN: INTRAVENOUS | Status: DC | PRN

## 2019-09-28 MED ORDER — METHYLPREDNISOLONE SODIUM SUCC 125 MG IJ SOLR: 125.0000 mg | Freq: Once | INTRAMUSCULAR | Status: DC | PRN

## 2019-09-28 MED ORDER — DIPHENHYDRAMINE HCL 50 MG/ML IJ SOLN: 50.0000 mg | Freq: Once | INTRAMUSCULAR | Status: DC | PRN

## 2019-09-28 NOTE — Progress Notes (Signed)
  Diagnosis: COVID-19  Physician:Dr Wright  Procedure: [facility] Med: casirivimab\imdevimab infusion - Provided patient with casirivimab\imdevimab fact sheet for patients, parents and caregivers prior to infusion.  Complications: No immediate complications noted.  Discharge: Discharged home   Jeffery Blackburn 09/28/2019  

## 2019-09-28 NOTE — Discharge Instructions (Signed)

## 2020-06-01 ENCOUNTER — Encounter: Payer: Self-pay | Admitting: Emergency Medicine

## 2020-06-01 ENCOUNTER — Other Ambulatory Visit: Payer: Self-pay

## 2020-06-01 ENCOUNTER — Emergency Department (INDEPENDENT_AMBULATORY_CARE_PROVIDER_SITE_OTHER)
Admission: EM | Admit: 2020-06-01 | Discharge: 2020-06-01 | Disposition: A | Discharge status: Home / Self Care | Payer: BC Managed Care – PPO | Source: Home / Self Care

## 2020-06-01 DIAGNOSIS — H1032 Unspecified acute conjunctivitis, left eye: Secondary | ICD-10-CM | POA: Diagnosis not present

## 2020-06-01 MED ORDER — TOBRAMYCIN 0.3 % OP SOLN: 1.0000 [drp] | OPHTHALMIC | 0 refills | Status: DC

## 2020-06-01 NOTE — Discharge Instructions (Signed)
Return if any problems.

## 2020-06-01 NOTE — ED Triage Notes (Signed)
LT eye pain, redness worse today, irritation had sinus infection about three weeks ago. Using hot compresses. Vaccinated

## 2020-06-03 ENCOUNTER — Emergency Department (INDEPENDENT_AMBULATORY_CARE_PROVIDER_SITE_OTHER)
Admission: EM | Admit: 2020-06-03 | Discharge: 2020-06-03 | Disposition: A | Discharge status: Home / Self Care | Payer: BC Managed Care – PPO | Source: Home / Self Care

## 2020-06-03 ENCOUNTER — Encounter: Payer: Self-pay | Admitting: Emergency Medicine

## 2020-06-03 ENCOUNTER — Telehealth: Payer: Self-pay | Admitting: Emergency Medicine

## 2020-06-03 DIAGNOSIS — H02844 Edema of left upper eyelid: Secondary | ICD-10-CM | POA: Diagnosis not present

## 2020-06-03 DIAGNOSIS — H1032 Unspecified acute conjunctivitis, left eye: Secondary | ICD-10-CM

## 2020-06-03 MED ORDER — PREDNISONE 20 MG PO TABS: ORAL_TABLET | ORAL | 0 refills | Status: AC

## 2020-06-03 MED ORDER — MOXIFLOXACIN HCL 0.5 % OP SOLN: 1.0000 [drp] | Freq: Three times a day (TID) | OPHTHALMIC | 0 refills | Status: AC

## 2020-06-03 NOTE — ED Provider Notes (Signed)
[facility]    CSN: [id number] Arrival date & time: 06/01/20  1550      History   Chief Complaint Chief Complaint  Patient presents with  . Eye Pain    HPI Jeffery Blackburn is a 60 y.o. male.   The history is provided by the patient. No language interpreter was used.  Eye Pain This is a new problem. The problem occurs constantly. Nothing aggravates the symptoms. Nothing relieves the symptoms. He has tried nothing for the symptoms. The treatment provided no relief.   Pt complains of pain and drainge from his left eye  Past Medical History:  Diagnosis Date  . Epididymal cyst 10/23/2015   Right seen on [place] October 2017    Patient Active Problem List   Diagnosis Date Noted  . HTN (hypertension) 10/21/2016  . Vitamin D deficiency 10/21/2016  . HLD (hyperlipidemia) 11/07/2015  . Smoker 10/23/2015  . Epididymal cyst 10/23/2015  . Morbid obesity (HCC) 10/23/2015    Past Surgical History:  Procedure Laterality Date  . KNEE SURGERY    . NASAL SINUS SURGERY         Home Medications    Prior to Admission medications   Medication Sig Start Date End Date Taking? Authorizing Provider  tobramycin (TOBREX) 0.3 % ophthalmic solution Place 1 drop into the right eye every 4 (four) hours for 10 days. 06/01/20 06/11/20 Yes Elson Areas, PA-C    Family History Family History  Problem Relation Age of Onset  . Healthy Mother   . Aneurysm Father     Social History Social History   Tobacco Use  . Smoking status: Current Every Day Smoker    Packs/day: 0.50    Types: Cigarettes  . Smokeless tobacco: Never Used  Vaping Use  . Vaping Use: Never used  Substance Use Topics  . Alcohol use: Yes    Alcohol/week: 8.0 standard drinks    Types: 8 Cans of beer per week  . Drug use: No     Allergies   Patient has no known allergies.   Review of Systems Review of Systems  Eyes: Positive for pain.  All other systems reviewed and are negative.    Physical  Exam Triage Vital Signs ED Triage Vitals  Enc Vitals Group     BP 06/01/20 1619 (!) 158/92     Pulse Rate 06/01/20 1619 61     Resp 06/01/20 1619 18     Temp 06/01/20 1619 98.8 F (37.1 C)     Temp Source 06/01/20 1619 Oral     SpO2 06/01/20 1619 96 %     Weight 06/01/20 1620 240 lb (108.9 kg)     Height 06/01/20 1620 5\' 10"  (1.778 m)     Head Circumference --      Peak Flow --      Pain Score 06/01/20 1619 2     Pain Loc --      Pain Edu? --      Excl. in GC? --    No data found.  Updated Vital Signs BP (!) 158/92 (BP Location: Right Arm)   Pulse 61   Temp 98.8 F (37.1 C) (Oral)   Resp 18   Ht 5\' 10"  (1.778 m)   Wt 108.9 kg   SpO2 96%   BMI 34.44 kg/m   Visual Acuity Right Eye Distance: 20/25 Left Eye Distance: 20/30 Bilateral Distance: 20/25 (un corrected)  Right Eye Near:   Left Eye Near:  Bilateral Near:     Physical Exam Vitals reviewed.  Eyes:     General:        Left eye: Discharge present.    Extraocular Movements: Extraocular movements intact.     Pupils: Pupils are equal, round, and reactive to light.  Cardiovascular:     Rate and Rhythm: Normal rate.  Pulmonary:     Effort: Pulmonary effort is normal.  Skin:    General: Skin is warm.  Neurological:     General: No focal deficit present.     Mental Status: He is alert.  Psychiatric:        Mood and Affect: Mood normal.      UC Treatments / Results  Labs (all labs ordered are listed, but only abnormal results are displayed) Labs Reviewed - No data to display  EKG   Radiology No results found.  Procedures Procedures (including critical care time)  Medications Ordered in UC Medications - No data to display  Initial Impression / Assessment and Plan / UC Course  I have reviewed the triage vital signs and the nursing notes.  Pertinent labs & imaging results that were available during my care of the patient were reviewed by me and considered in my medical decision making (see  chart for details).     MDM:  Pt counseled on conjunctivitis  Final Clinical Impressions(s) / UC Diagnoses   Final diagnoses:  Acute conjunctivitis of left eye, unspecified acute conjunctivitis type     Discharge Instructions     Return if any problems    ED Prescriptions    Medication Sig Dispense Auth. Provider   tobramycin (TOBREX) 0.3 % ophthalmic solution Place 1 drop into the right eye every 4 (four) hours for 10 days. 5 mL Elson Areas, [place]     PDMP not reviewed this encounter.  An After Visit Summary was printed and given to the patient.    Elson Areas, [place] 06/03/20 1339

## 2020-06-03 NOTE — ED Triage Notes (Signed)
Left scelera red/bloodshot  Has not improved since last visit Warm compresses - no relief  Ice helped w/ pain  Tylenol since wed - every 6 hours No vision changes

## 2020-06-03 NOTE — ED Provider Notes (Signed)
[facility]    CSN: [id number] Arrival date & time: 06/03/20  1418      History   Chief Complaint Chief Complaint  Patient presents with  . Follow-up    eyes    HPI Jeffery Blackburn is a 60 y.o. male.   HPI 60 year old male presents with left eye redness.  Patient was evaluated here on Thursday, 06/03/2020 diagnosed with acute left conjunctivitis and prescribed tobramycin ophthalmic solution.  Patient reports has been using prescribed ophthalmic drops for 2 days since initial evaluation and left eye symptoms of redness and pain have been worsening.  Visual acuity measurements today are the same as they were on Thursday, 06/01/2020.  Past Medical History:  Diagnosis Date  . Epididymal cyst 10/23/2015   Right seen on [place] October 2017    Patient Active Problem List   Diagnosis Date Noted  . HTN (hypertension) 10/21/2016  . Vitamin D deficiency 10/21/2016  . HLD (hyperlipidemia) 11/07/2015  . Smoker 10/23/2015  . Epididymal cyst 10/23/2015  . Morbid obesity (HCC) 10/23/2015    Past Surgical History:  Procedure Laterality Date  . KNEE SURGERY    . NASAL SINUS SURGERY         Home Medications    Prior to Admission medications   Medication Sig Start Date End Date Taking? Authorizing Provider  moxifloxacin (VIGAMOX) 0.5 % ophthalmic solution Place 1 drop into the left eye 3 (three) times daily for 7 days. 06/03/20 06/10/20 Yes Trevor Iha, FNP  predniSONE (DELTASONE) 20 MG tablet Take 3 tabs PO daily x 5 days. 06/03/20  Yes Trevor Iha, FNP    Family History Family History  Problem Relation Age of Onset  . Healthy Mother   . Aneurysm Father     Social History Social History   Tobacco Use  . Smoking status: Current Every Day Smoker    Packs/day: 0.50    Types: Cigarettes  . Smokeless tobacco: Never Used  Vaping Use  . Vaping Use: Never used  Substance Use Topics  . Alcohol use: Yes    Alcohol/week: 8.0 standard drinks    Types: 8 Cans of  beer per week  . Drug use: No     Allergies   Patient has no known allergies.   Review of Systems Review of Systems  Constitutional: Negative.   HENT: Negative.   Eyes: Positive for pain and redness.  Respiratory: Negative.   Cardiovascular: Negative.   Gastrointestinal: Negative.   Genitourinary: Negative.   Musculoskeletal: Negative.   Skin: Negative.   Neurological: Negative.      Physical Exam Triage Vital Signs ED Triage Vitals [06/03/20 1528]  Enc Vitals Group     BP (!) 153/84     Pulse Rate 75     Resp 16     Temp 99.1 F (37.3 C)     Temp Source Oral     SpO2 95 %     Weight      Height      Head Circumference      Peak Flow      Pain Score 3     Pain Loc      Pain Edu?      Excl. in GC?    No data found.  Updated Vital Signs BP (!) 153/84 (BP Location: Right Arm)   Pulse 75   Temp 99.1 F (37.3 C) (Oral)   Resp 16   SpO2 95%   Visual Acuity Right Eye Distance: 20/25 Left  Eye Distance: 20/30 Bilateral Distance: 20/25     Physical Exam Vitals and nursing note reviewed.  Constitutional:      General: He is not in acute distress.    Appearance: Normal appearance. He is normal weight. He is not ill-appearing.  HENT:     Head: Normocephalic and atraumatic.     Mouth/Throat:     Mouth: Mucous membranes are moist.     Pharynx: Oropharynx is clear.  Eyes:     Extraocular Movements: Extraocular movements intact.     Pupils: Pupils are equal, round, and reactive to light.     Comments: Left eye conjunctivae +4 injected: Initial gently flushed with normal saline, affected no foreign bodies identified; 4 drops of tetracaine hydrochloride ophthalmic instilled into left eye, 4 minutes upper lower eyelid was inverted and inspected, no foreign bodies identified; fluorescein sodium ophthalmic strip applied with no corneal abrasion noted.  Left eye finally irrigated gently with normal saline.  Patient tolerated procedure well without complaint.   Cardiovascular:     Rate and Rhythm: Normal rate and regular rhythm.     Pulses: Normal pulses.     Heart sounds: Normal heart sounds.  Pulmonary:     Effort: Pulmonary effort is normal.     Breath sounds: Normal breath sounds. No wheezing, rhonchi or rales.  Musculoskeletal:        General: Normal range of motion.     Cervical back: Normal range of motion and neck supple. No tenderness.  Lymphadenopathy:     Cervical: No cervical adenopathy.  Skin:    General: Skin is warm.  Neurological:     General: No focal deficit present.     Mental Status: He is alert and oriented to person, place, and time.  Psychiatric:        Mood and Affect: Mood normal.        Behavior: Behavior normal.      UC Treatments / Results  Labs (all labs ordered are listed, but only abnormal results are displayed) Labs Reviewed - No data to display  EKG   Radiology No results found.  Procedures Procedures (including critical care time)  Medications Ordered in UC Medications - No data to display  Initial Impression / Assessment and Plan / UC Course  I have reviewed the triage vital signs and the nursing notes.  Pertinent labs & imaging results that were available during my care of the patient were reviewed by me and considered in my medical decision making (see chart for details).    MDM: 1.  Acute conjunctivitis of left eye, 2.  Swelling of left upper eyelid.  Patient discharged home, hemodynamically stable. Final Clinical Impressions(s) / UC Diagnoses   Final diagnoses:  Acute conjunctivitis of left eye, unspecified acute conjunctivitis type  Swelling of left upper eyelid     Discharge Instructions     Instructed patient to discontinue previously prescribed tobramycin ophthalmic.  Advised patient to take medication as directed.  Advised patient to take prednisone on with food to completion.  Encourage patient increase daily water intake while taking these medications.  Advised patient  if left eye redness or pain worsens and/or unresolved please follow-up here or with PCP for further evaluation.    ED Prescriptions    Medication Sig Dispense Auth. Provider   moxifloxacin (VIGAMOX) 0.5 % ophthalmic solution Place 1 drop into the left eye 3 (three) times daily for 7 days. 3 mL Trevor Iha, FNP   predniSONE (DELTASONE) 20 MG tablet  Take 3 tabs PO daily x 5 days. 15 tablet Trevor Iha, FNP     PDMP not reviewed this encounter.   Trevor Iha, FNP 06/03/20 [phone]

## 2020-06-03 NOTE — Discharge Instructions (Addendum)
Instructed patient to discontinue previously prescribed tobramycin ophthalmic.  Advised patient to take medication as directed.  Advised patient to take prednisone on with food to completion.  Encourage patient increase daily water intake while taking these medications.  Advised patient if left eye redness or pain worsens and/or unresolved please follow-up here or with PCP for further evaluation.

## 2020-06-03 NOTE — Telephone Encounter (Signed)
Return call to [facility] who states eye is more painful w/ redness to the sclera w/ swelling to the eyelid- pt confirmed he is using the gtts as directed. Pt to return for a recheck today

## 2020-06-09 ENCOUNTER — Other Ambulatory Visit: Payer: Self-pay

## 2020-06-09 ENCOUNTER — Emergency Department (INDEPENDENT_AMBULATORY_CARE_PROVIDER_SITE_OTHER)
Admission: EM | Admit: 2020-06-09 | Discharge: 2020-06-09 | Disposition: A | Discharge status: Home / Self Care | Payer: BC Managed Care – PPO | Source: Home / Self Care

## 2020-06-09 DIAGNOSIS — H5712 Ocular pain, left eye: Secondary | ICD-10-CM

## 2020-06-09 DIAGNOSIS — H5789 Other specified disorders of eye and adnexa: Secondary | ICD-10-CM | POA: Diagnosis not present

## 2020-06-09 NOTE — ED Provider Notes (Signed)
[facility]    CSN: [id number] Arrival date & time: 06/09/20  1833      History   Chief Complaint Chief Complaint  Patient presents with  . Eye Pain    HPI Jeffery Blackburn is a 60 y.o. male who presents today for the 3rd time due to L eye pain and redness. The first time was diagnosed with conjunctivitis and was placed on antibiotic eye gtts and 3 days later developed lid swelling. He was examined here a second time and had fluorecin exam negative and was placed on oral antibiotics and prednisone and with this treatment his lids went back down to normal and on the 2nd day of the prednisone the red eyes resolved. He finished the prednisone 2 days ago, and last night he developed eye ball pain and redness again. The  HA and the pain gets so bad he gets nausea. Denies tearing or photophobia, vision changes and has had to take OTC pain meds with mild relief.  Was told to Fu with PCP if not better, but he does not have a PCP   Past Medical History:  Diagnosis Date  . Epididymal cyst 10/23/2015   Right seen on [place] October 2017    Patient Active Problem List   Diagnosis Date Noted  . HTN (hypertension) 10/21/2016  . Vitamin D deficiency 10/21/2016  . HLD (hyperlipidemia) 11/07/2015  . Smoker 10/23/2015  . Epididymal cyst 10/23/2015  . Morbid obesity (HCC) 10/23/2015    Past Surgical History:  Procedure Laterality Date  . KNEE SURGERY    . NASAL SINUS SURGERY         Home Medications    Prior to Admission medications   Medication Sig Start Date End Date Taking? Authorizing Provider  moxifloxacin (VIGAMOX) 0.5 % ophthalmic solution Place 1 drop into the left eye 3 (three) times daily for 7 days. 06/03/20 06/10/20  Trevor Iha, FNP  predniSONE (DELTASONE) 20 MG tablet Take 3 tabs PO daily x 5 days. 06/03/20   Trevor Iha, FNP    Family History Family History  Problem Relation Age of Onset  . Healthy Mother   . Aneurysm Father     Social History Social  History   Tobacco Use  . Smoking status: Current Every Day Smoker    Packs/day: 0.50    Types: Cigarettes  . Smokeless tobacco: Current User    Types: Snuff  Vaping Use  . Vaping Use: Never used  Substance Use Topics  . Alcohol use: Yes    Alcohol/week: 8.0 standard drinks    Types: 8 Cans of beer per week  . Drug use: No     Allergies   Patient has no known allergies.   Review of Systems Review of Systems  Eyes: Positive for pain and redness. Negative for photophobia, discharge, itching and visual disturbance.  Neurological: Positive for headaches.     Physical Exam Triage Vital Signs ED Triage Vitals  Enc Vitals Group     BP 06/09/20 1842 132/83     Pulse Rate 06/09/20 1842 (!) 58     Resp 06/09/20 1842 14     Temp 06/09/20 1842 (!) 97.5 F (36.4 C)     Temp Source 06/09/20 1842 Oral     SpO2 06/09/20 1842 98 %     Weight 06/09/20 1839 240 lb (108.9 kg)     Height 06/09/20 1839 5\' 10"  (1.778 m)     Head Circumference --      Peak  Flow --      Pain Score 06/09/20 1839 5     Pain Loc --      Pain Edu? --      Excl. in GC? --    No data found.  Updated Vital Signs BP 132/83 (BP Location: Left Arm)   Pulse (!) 58   Temp (!) 97.5 F (36.4 C) (Oral)   Resp 14   Ht 5\' 10"  (1.778 m)   Wt 240 lb (108.9 kg)   SpO2 98%   BMI 34.44 kg/m   Visual Acuity Right Eye Distance: 20/20 Left Eye Distance: 20/20 Bilateral Distance: 20/20  Right Eye Near:   Left Eye Near:    Bilateral Near:     Physical Exam Vitals and nursing note reviewed.  Constitutional:      General: He is not in acute distress.    Appearance: He is obese. He is not toxic-appearing.  HENT:     Head: Normocephalic.     Right Ear: External ear normal.     Left Ear: External ear normal.  Eyes:     General: Lids are normal. Vision grossly intact. No scleral icterus.    Extraocular Movements: Extraocular movements intact.     Conjunctiva/sclera:     Left eye: Left conjunctiva is  injected.     Pupils: Pupils are equal, round, and reactive to light.     Comments: Does not have perilimbal erythema. The worse redness in upper globe  Pulmonary:     Effort: Pulmonary effort is normal.  Musculoskeletal:     Cervical back: Neck supple.  Neurological:     Mental Status: He is alert.      UC Treatments / Results  Labs (all labs ordered are listed, but only abnormal results are displayed) Labs Reviewed - No data to display  EKG   Radiology No results found.  Procedures Procedures (including critical care time)  Medications Ordered in UC Medications - No data to display  Initial Impression / Assessment and Plan / UC Course  I have reviewed the triage vital signs and the nursing notes. I searched for ophthalmologist that may be open tomorrow, but could not find anyone.  L eye pain and redness. I sent him to ER to have his eye pressure checked and see if ophthalmology needs to be consulted if so.  Final Clinical Impressions(s) / UC Diagnoses   Final diagnoses:  Left eye pain  Red eye     Discharge Instructions     Your vision exam is better than the last time I cant find an eye doctor that can see you today, so the best thing is to go to ER  right now so they can call the ophthalmologist on call.      ED Prescriptions    None     PDMP not reviewed this encounter.   , Garey Ham 06/09/20 1919

## 2020-06-09 NOTE — ED Triage Notes (Signed)
Pt presents with L eye pain and redness. Pt st this has not gotten better even after two visits here. Pt has been prescribed both prednisone and eye gtts with little relief. Pt st this has been going on 2 weeks.

## 2020-06-09 NOTE — Discharge Instructions (Signed)
Your vision exam is better than the last time I cant find an eye doctor that can see you today, so the best thing is to go to ER  right now so they can call the ophthalmologist on call.

## 2020-06-23 DIAGNOSIS — H15122 Nodular episcleritis, left eye: Secondary | ICD-10-CM | POA: Diagnosis not present

## 2020-12-21 ENCOUNTER — Telehealth (INDEPENDENT_AMBULATORY_CARE_PROVIDER_SITE_OTHER): Payer: BC Managed Care – PPO | Admitting: Family Medicine

## 2020-12-21 ENCOUNTER — Encounter: Payer: Self-pay | Admitting: Family Medicine

## 2020-12-21 DIAGNOSIS — J111 Influenza due to unidentified influenza virus with other respiratory manifestations: Secondary | ICD-10-CM | POA: Diagnosis not present

## 2020-12-21 MED ORDER — OSELTAMIVIR PHOSPHATE 75 MG PO CAPS: 75.0000 mg | ORAL_CAPSULE | Freq: Two times a day (BID) | ORAL | 0 refills | Status: AC

## 2020-12-21 NOTE — Progress Notes (Signed)
Virtual Video Visit via MyChart Note  I connected with  Jeffery Blackburn on 12/21/20 at  4:00 PM EST by the video enabled telemedicine application for MyChart, and verified that I am speaking with the correct person using two identifiers.   I introduced myself as a Publishing rights manager with the practice. We discussed the limitations of evaluation and management by telemedicine and the availability of in person appointments. The patient expressed understanding and agreed to proceed.  Participating parties in this visit include: The patient and the nurse practitioner listed.  The patient is: At home I am: In the office - Primary Care Jeffery Blackburn  Subjective:    CC:  Chief Complaint  Patient presents with   FLU LIKE SYMPTOMS    HPI: Jeffery Blackburn is a 60 y.o. year old male presenting today via MyChart today for flu-like illness.  Patient states that yesterday morning he felt like he got sick all of a sudden with body aches, fever, head/chest congestion, cough, headaches, fatigue, sweats/chills. States he has had 2 recent exposures to people who tested positive for flu. He had a negative home COVID test. He has been getting some temporary relief with Coricidin. He has not had a flu shot this year. He denies any chest pain, trouble breathing, GI/GU symptoms.      Past medical history, Surgical history, Family history not pertinant except as noted below, Social history, Allergies, and medications have been entered into the medical record, reviewed, and corrections made.   Review of Systems:  All review of systems negative except what is listed in the HPI   Objective:    General:  Speaking clearly in complete sentences. Absent shortness of breath noted.   Alert and oriented x3.   Normal judgment.  Absent acute distress.   Impression and Recommendations:    1. Influenza-like illness - oseltamivir (TAMIFLU) 75 MG capsule; Take 1 capsule (75 mg total) by mouth 2 (two) times daily.   Dispense: 10 capsule; Refill: 0  Flu-like symptoms with 2 recent exposures to confirmed flu. Will send in tamiflu. Continue Coricidin and OTC analgesics as needed. Continue supportive measures including rest, hydration, humidifier use, steam showers, warm compresses to sinuses, warm liquids with lemon and honey, and over-the-counter cough, cold, and analgesics as needed.  Patient aware of signs/symptoms requiring further/urgent evaluation.     Follow-up if symptoms worsen or fail to improve.    I discussed the assessment and treatment plan with the patient. The patient was provided an opportunity to ask questions and all were answered. The patient agreed with the plan and demonstrated an understanding of the instructions.   The patient was advised to call back or seek an in-person evaluation if the symptoms worsen or if the condition fails to improve as anticipated.  I spent 20 minutes dedicated to the care of this patient on the date of this encounter to include pre-visit chart review of prior notes and results, face-to-face time with the patient, and post-visit ordering of testing as indicated.   Clayborne Dana, NP

## 2020-12-21 NOTE — Patient Instructions (Signed)
Over the counter medications that may be helpful for symptoms:  Guaifenesin 1200 mg extended release tabs twice daily, with plenty of water For cough and congestion Brand name: Mucinex   Pseudoephedrine 30 mg, one or two tabs every 4 to 6 hours For sinus congestion Brand name: Sudafed You must get this from the pharmacy counter.  Oxymetazoline nasal spray each morning, one spray in each nostril, for NO MORE THAN 3 days  For nasal and sinus congestion Brand name: Afrin Saline nasal spray or Saline Nasal Irrigation 3-5 times a day For nasal and sinus congestion Brand names: Ocean or AYR Fluticasone nasal spray, one spray in each nostril, each morning (after oxymetazoline and saline, if used) For nasal and sinus congestion Brand name: Flonase Warm salt water gargles  For sore throat Every few hours as needed Alternate ibuprofen 400-600 mg and acetaminophen 1000 mg every 4-6 hours For fever, body aches, headache Brand names: Motrin or Advil and Tylenol Dextromethorphan 12-hour cough version 30 mg every 12 hours  For cough Brand name: Delsym Stop all other cold medications for now (Nyquil, Dayquil, Tylenol Cold, Theraflu, etc) and other non-prescription cough/cold preparations. Many of these have the same ingredients listed above and could cause an overdose of medication.   Herbal treatments that have been shown to be helpful in some patients include: Vitamin C 1000mg per day Vitamin D 4000iU per day Zinc 100mg per day Quercetin 25-500mg twice a day Melatonin 5-10mg at bedtime  General Instructions Allow your body to rest Drink PLENTY of fluids Isolate yourself from everyone, even family, until test results have returned    If you develop severe shortness of breath, uncontrolled fevers, coughing up blood, confusion, chest pain, or signs of dehydration (such as significantly decreased urine amounts or dizziness with standing) please go to the ER.
